# Patient Record
Sex: Male | Born: 1995 | Race: Black or African American | Hispanic: No | Marital: Single | Smoking: Never smoker
Health system: Southern US, Community
[De-identification: ages and names within clinical notes are randomized; demographics above are authoritative.]

## PROBLEM LIST (undated history)

## (undated) DIAGNOSIS — J45909 Unspecified asthma, uncomplicated: Secondary | ICD-10-CM

## (undated) DIAGNOSIS — J939 Pneumothorax, unspecified: Secondary | ICD-10-CM

---

## 2015-04-05 ENCOUNTER — Emergency Department (HOSPITAL_COMMUNITY)
Admission: EM | Admit: 2015-04-05 | Discharge: 2015-04-06 | Disposition: A | Discharge status: Home / Self Care | Payer: 59 | Attending: Emergency Medicine | Admitting: Emergency Medicine

## 2015-04-05 ENCOUNTER — Emergency Department (HOSPITAL_COMMUNITY): Payer: 59

## 2015-04-05 ENCOUNTER — Encounter (HOSPITAL_COMMUNITY): Payer: Self-pay | Admitting: Family Medicine

## 2015-04-05 DIAGNOSIS — J45909 Unspecified asthma, uncomplicated: Secondary | ICD-10-CM | POA: Insufficient documentation

## 2015-04-05 DIAGNOSIS — R079 Chest pain, unspecified: Secondary | ICD-10-CM | POA: Diagnosis present

## 2015-04-05 DIAGNOSIS — M940 Chondrocostal junction syndrome [Tietze]: Secondary | ICD-10-CM | POA: Insufficient documentation

## 2015-04-05 HISTORY — DX: Pneumothorax, unspecified: J93.9

## 2015-04-05 HISTORY — DX: Unspecified asthma, uncomplicated: J45.909

## 2015-04-05 MED ORDER — KETOROLAC TROMETHAMINE 60 MG/2ML IM SOLN
60.0000 mg | Freq: Once | INTRAMUSCULAR | Status: AC
  Administered 2015-04-05: 60 mg via INTRAMUSCULAR
  Filled 2015-04-05: qty 2

## 2015-04-05 NOTE — ED Notes (Signed)
Pt reports he noticed right sided chest pain when waking. Pain is described as sharp and radiating to left chest. Also, complains of shortness of breath, back pain and weakness. Pt was ambulatory to the room with no signs of acute distress. Pt reports a hx of pneumothorax.

## 2015-04-05 NOTE — ED Provider Notes (Signed)
CSN: [id number]     Arrival date & time 04/05/15  2302 History  By signing my name below, I, Budd Palmer, attest that this documentation has been prepared under the direction and in the presence of Eden Rho, MD. Electronically Signed: Budd Palmer, ED Scribe. 04/05/2015. 11:31 PM.      Chief Complaint  Patient presents with  . Chest Pain   Patient is a 19 y.o. male presenting with chest pain. The history is provided by the patient. No language interpreter was used.  Chest Pain Pain location:  R chest Pain quality: sharp   Pain radiates to:  Does not radiate Pain radiates to the back: no   Pain severity:  Moderate Duration:  6 hours Timing:  Constant Chronicity:  New Context: movement and at rest   Context comment:  Bending forward Relieved by:  Nothing Worsened by:  Certain positions Associated symptoms: no cough, no diaphoresis, no fever, no nausea, no palpitations, no shortness of breath and not vomiting   Risk factors: not obese, no prior DVT/PE and no surgery    HPI Comments: Ethan Francis is a 19 y.o. male with a PMHx of asthma and pneumothorax who presents to the Emergency Department complaining of sharp, constant, aching, right-sided chest pain onset 6 hours ago after waking up. He notes he ate immediately after waking up and noted no exacerbation of the pain. He notes exacerbation with bending forward. He states he works out daily and lifts weights up to 300 lbs, but denies any weight lifting within the last 48 hours. He notes he did go running. He denies any recent long car trips or traveling anywhere. He notes he has tried taking aleve with no relief. Pt denies fever or cough.   Past Medical History  Diagnosis Date  . Asthma   . Pneumothorax    History reviewed. No pertinent past surgical history. History reviewed. No pertinent family history. Social History  Substance Use Topics  . Smoking status: Never Smoker   . Smokeless tobacco: None  . Alcohol Use:  Yes     Comment: Once every two months.     Review of Systems  Constitutional: Negative for fever and diaphoresis.  Respiratory: Negative for cough, chest tightness and shortness of breath.   Cardiovascular: Positive for chest pain. Negative for palpitations and leg swelling.  Gastrointestinal: Negative for nausea and vomiting.  Musculoskeletal: Negative for arthralgias and neck pain.  All other systems reviewed and are negative.   Allergies  Review of patient's allergies indicates not on file.  Home Medications   Prior to Admission medications   Not on File   BP 124/77 mmHg  Pulse 58  Temp(Src) 97.8 F (36.6 C) (Oral)  Resp 16  Ht  (1.803 m)  Wt 160 lb (72.576 kg)  BMI 22.33 kg/m2  SpO2 99% Physical Exam  Constitutional: He is oriented to person, place, and time. He appears well-developed and well-nourished.  HENT:  Head: Normocephalic and atraumatic.  Mouth/Throat: Oropharynx is clear and moist.  Eyes: Conjunctivae are normal. Pupils are equal, round, and reactive to light. Right eye exhibits no discharge. Left eye exhibits no discharge.  Neck: Normal range of motion. Neck supple.  Cardiovascular: Normal rate, regular rhythm, normal heart sounds and intact distal pulses.   Pulmonary/Chest: Effort normal and breath sounds normal. No respiratory distress. He has no wheezes. He has no rales. He exhibits tenderness (over the costal-chondreal joint 6, 7, and 10).  Abdominal: Soft. Bowel sounds are normal. He  exhibits no distension and no mass. There is no tenderness. There is no rebound and no guarding.  Musculoskeletal: Normal range of motion. He exhibits no edema or tenderness.  All compartments are soft, no cords.  Negative Homan's sign  Neurological: He is alert and oriented to person, place, and time. Coordination normal.  Skin: Skin is warm and dry. No rash noted. He is not diaphoretic. No erythema.  Psychiatric: He has a normal mood and affect.  Nursing note and  vitals reviewed.   ED Course  Procedures  DIAGNOSTIC STUDIES: Oxygen Saturation is 99% on RA, normal by my interpretation.    COORDINATION OF CARE: 11:22 PM - Discussed normal EKG. Discussed plans to wait on chest XR and diagnostic studies, as well as to order a dose of Toradol. Pt advised of plan for treatment and pt agrees.  Labs Review Labs Reviewed  BASIC METABOLIC PANEL  CBC  I-STAT TROPOININ, ED    Imaging Review No results found. I have personally reviewed and evaluated these images and lab results as part of my medical decision-making.   EKG Interpretation None      MDM   Final diagnoses:  None    PERC negative wells 0 highly doubt PE.   Pain is reproducible,  And positional and is consistent with MSK pain.  Lungs clear.    I personally performed the services described in this documentation, which was scribed in my presence. The recorded information has been reviewed and is accurate.     Ethan BlamerApril Briannia Laba, MD 04/06/15 [phone]

## 2015-04-06 ENCOUNTER — Encounter (HOSPITAL_COMMUNITY): Payer: Self-pay | Admitting: Emergency Medicine

## 2015-04-06 DIAGNOSIS — M940 Chondrocostal junction syndrome [Tietze]: Secondary | ICD-10-CM | POA: Diagnosis not present

## 2015-04-06 MED ORDER — GI COCKTAIL ~~LOC~~
30.0000 mL | Freq: Once | ORAL | Status: AC
  Administered 2015-04-06: 30 mL via ORAL
  Filled 2015-04-06: qty 30

## 2015-04-06 MED ORDER — IBUPROFEN 800 MG PO TABS
800.0000 mg | ORAL_TABLET | Freq: Three times a day (TID) | ORAL | Status: AC
Start: 2015-04-06 — End: ?

## 2015-04-06 NOTE — Discharge Instructions (Signed)
Chest Wall Pain °Chest wall pain is pain in or around the bones and muscles of your chest. Sometimes, an injury causes this pain. Sometimes, the cause may not be known. This pain may take several weeks or longer to get better. °HOME CARE °Pay attention to any changes in your symptoms. Take these actions to help with your pain: °· Rest as told by your doctor. °· Avoid activities that cause pain. Try not to use your chest, belly (abdominal), or side muscles to lift heavy things. °· If directed, apply ice to the painful area: °¨ Put ice in a plastic bag. °¨ Place a towel between your skin and the bag. °¨ Leave the ice on for 20 minutes, 2-3 times per day. °· Take over-the-counter and prescription medicines only as told by your doctor. °· Do not use tobacco products, including cigarettes, chewing tobacco, and e-cigarettes. If you need help quitting, ask your doctor. °· Keep all follow-up visits as told by your doctor. This is important. °GET HELP IF: °· You have a fever. °· Your chest pain gets worse. °· You have new symptoms. °GET HELP RIGHT AWAY IF: °· You feel sick to your stomach (nauseous) or you throw up (vomit). °· You feel sweaty or light-headed. °· You have a cough with phlegm (sputum) or you cough up blood. °· You are short of breath. °  °This information is not intended to replace advice given to you by your health care provider. Make sure you discuss any questions you have with your health care provider. °  °Document Released: 09/28/2007 Document Revised: 12/31/2014 Document Reviewed: 07/07/2014 °[employer]vier Interactive Patient Education ©2016 [employer] ° °

## 2017-07-13 IMAGING — CR DG CHEST 2V
2 series · 2 of 2 positions shown · non-contrast
Comparison: None.

CLINICAL DATA: Mid chest pain and shortness of breath today.
History of asthma. Previous history of pneumothorax. Nonsmoker.

EXAM:
CHEST  2 VIEW

[w chest pa]
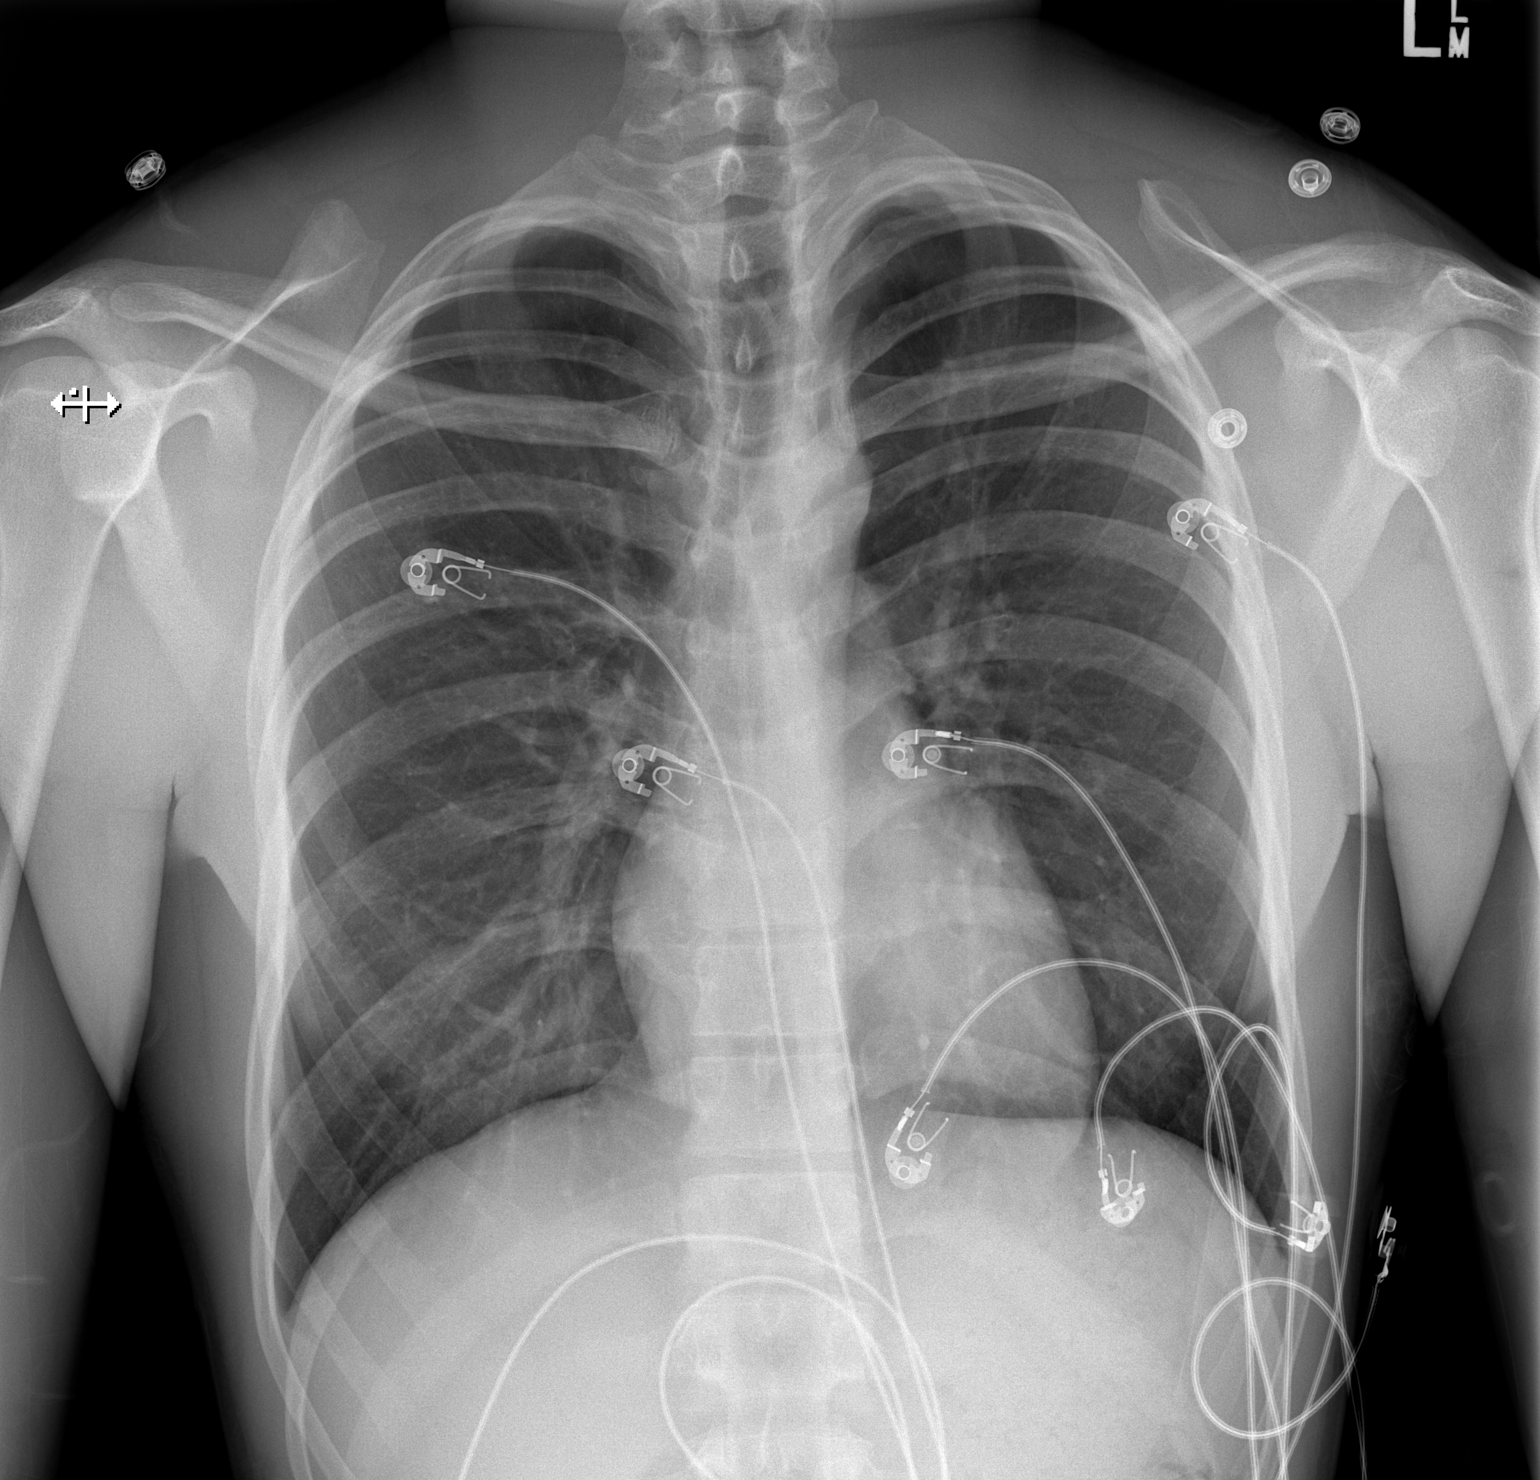

[w chest lat]
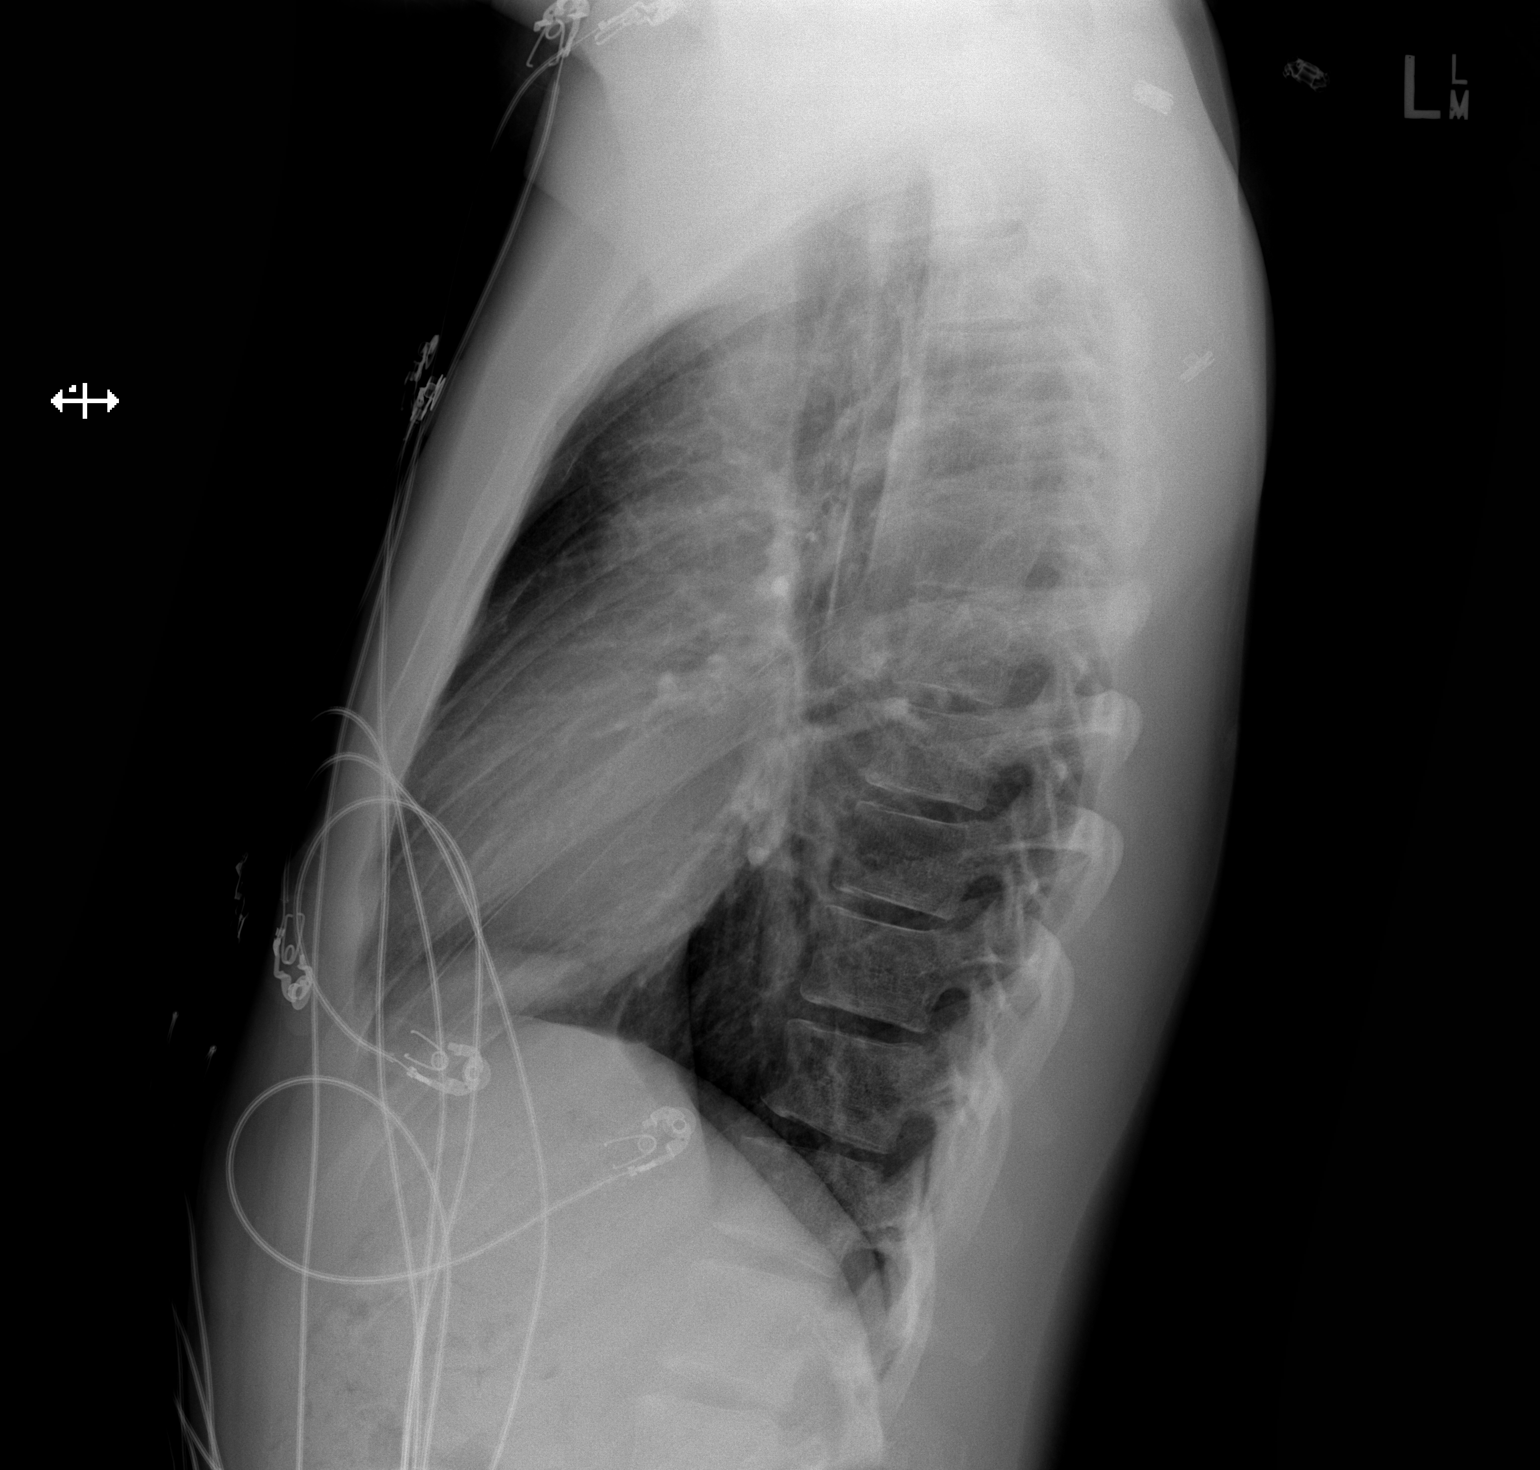

[2 of 2 positions shown; findings below may reference images not displayed]

FINDINGS: The heart size and mediastinal contours are within normal limits.
Both lungs are clear. The visualized skeletal structures are
unremarkable.
IMPRESSION: No active cardiopulmonary disease.
